# Patient Record
Sex: Female | Born: 1990 | Race: Black or African American | Hispanic: No | Marital: Single | Smoking: Never smoker
Health system: Southern US, Community
[De-identification: ages and names within clinical notes are randomized; demographics above are authoritative.]

## PROBLEM LIST (undated history)

## (undated) DIAGNOSIS — J45909 Unspecified asthma, uncomplicated: Secondary | ICD-10-CM

---

## 2012-04-25 ENCOUNTER — Emergency Department (HOSPITAL_COMMUNITY)
Admission: EM | Admit: 2012-04-25 | Discharge: 2012-04-25 | Disposition: A | Discharge status: Home / Self Care | Payer: BC Managed Care – PPO | Attending: Emergency Medicine | Admitting: Emergency Medicine

## 2012-04-25 ENCOUNTER — Encounter (HOSPITAL_COMMUNITY): Payer: Self-pay | Admitting: Emergency Medicine

## 2012-04-25 ENCOUNTER — Emergency Department (HOSPITAL_COMMUNITY): Payer: BC Managed Care – PPO

## 2012-04-25 DIAGNOSIS — R509 Fever, unspecified: Secondary | ICD-10-CM | POA: Insufficient documentation

## 2012-04-25 DIAGNOSIS — J029 Acute pharyngitis, unspecified: Secondary | ICD-10-CM | POA: Insufficient documentation

## 2012-04-25 DIAGNOSIS — IMO0001 Reserved for inherently not codable concepts without codable children: Secondary | ICD-10-CM | POA: Insufficient documentation

## 2012-04-25 DIAGNOSIS — R110 Nausea: Secondary | ICD-10-CM | POA: Insufficient documentation

## 2012-04-25 LAB — POCT I-STAT, CHEM 8
BUN: 4 mg/dL — ABNORMAL LOW (ref 6–23)
HCT: 36 % (ref 36.0–46.0)
Hemoglobin: 12.2 g/dL (ref 12.0–15.0)
Sodium: 137 mEq/L (ref 135–145)
TCO2: 23 mmol/L (ref 0–100)

## 2012-04-25 LAB — RAPID STREP SCREEN (MED CTR MEBANE ONLY): Streptococcus, Group A Screen (Direct): NEGATIVE

## 2012-04-25 MED ORDER — ACETAMINOPHEN 325 MG PO TABS
650.0000 mg | ORAL_TABLET | Freq: Once | ORAL | Status: AC
  Administered 2012-04-25: 650 mg via ORAL
  Filled 2012-04-25: qty 2

## 2012-04-25 MED ORDER — KETOROLAC TROMETHAMINE 30 MG/ML IJ SOLN
30.0000 mg | Freq: Once | INTRAMUSCULAR | Status: AC
  Administered 2012-04-25: 30 mg via INTRAVENOUS
  Filled 2012-04-25: qty 1

## 2012-04-25 MED ORDER — SODIUM CHLORIDE 0.9 % IV BOLUS (SEPSIS)
1000.0000 mL | Freq: Once | INTRAVENOUS | Status: AC
  Administered 2012-04-25: 1000 mL via INTRAVENOUS

## 2012-04-25 MED ORDER — ONDANSETRON HCL 4 MG/2ML IJ SOLN
4.0000 mg | Freq: Once | INTRAMUSCULAR | Status: AC
  Administered 2012-04-25: 4 mg via INTRAVENOUS
  Filled 2012-04-25: qty 2

## 2012-04-25 MED ORDER — OSELTAMIVIR PHOSPHATE 75 MG PO CAPS: 75.0000 mg | ORAL_CAPSULE | Freq: Two times a day (BID) | ORAL | Status: DC

## 2012-04-25 NOTE — ED Provider Notes (Signed)
History     CSN: [id number]  Arrival date & time 04/25/12  8295   First MD Initiated Contact with Patient 04/25/12 [phone]      Chief Complaint  Patient presents with  . Influenza    (Consider location/radiation/quality/duration/timing/severity/associated sxs/prior treatment) HPI Pt presents with 2 days of cough, sore throat, fever, body aches, also nausea.  She had emesis last night- nonbloody and nonbilious.  Fever began last night.  No abdominal pain.  Did not get flu shot.  No specific sick contacts.  She has not had tylenol or ibuprofen today.  Has been able to drink liquids, but less than usual.  There are no other associated systemic symptoms, there are no other alleviating or modifying factors.  History reviewed. No pertinent past medical history.  History reviewed. No pertinent past surgical history.  No family history on file.  History  Substance Use Topics  . Smoking status: Never Smoker   . Smokeless tobacco: Not on file  . Alcohol Use: Yes     Comment: occasional    OB History    Grav Para Term Preterm Abortions TAB SAB Ect Mult Living                  Review of Systems ROS reviewed and all otherwise negative except for mentioned in HPI  Allergies  Review of patient's allergies indicates no known allergies.  Home Medications   Current Outpatient Rx  Name  Route  Sig  Dispense  Refill  . ALBUTEROL SULFATE HFA 108 (90 BASE) MCG/ACT IN AERS   Inhalation   Inhale 1 puff into the lungs every 6 (six) hours as needed. For shortness of breath         . OSELTAMIVIR PHOSPHATE 75 MG PO CAPS   Oral   Take 1 capsule (75 mg total) by mouth every 12 (twelve) hours.   10 capsule   0     BP 122/64  Pulse 114  Temp 99.8 F (37.7 C) (Oral)  Resp 18  SpO2 100%  LMP 04/18/2012 Vitals reviewed Physical Exam Physical Examination: General appearance - alert, well appearing, and in no distress Mental status - alert, oriented to person, place, and time Eyes - no  scleral icterus, no conjunctival injection Mouth - mucous membranes moist, pharynx normal without lesions Chest - clear to auscultation, no wheezes, rales or rhonchi, symmetric air entry Heart - normal rate, regular rhythm, normal S1, S2, no murmurs, rubs, clicks or gallops Abdomen - soft, nontender, nondistended, no masses or organomegaly, nabs Extremities - peripheral pulses normal, no pedal edema, no clubbing or cyanosis Skin - normal coloration and turgor, no rashes  ED Course  Procedures (including critical care time)   11:41 AM Pt initially febrile and tachycardic, she has received 2L IV hydration with some improvement in HR, but still tachy- so hgb checked- pt not anemic.  She states she feels improved and is requesting discharge.  Pt still mildly tachycardic, instructed to increase fluid intake.   Labs Reviewed  POCT I-STAT, CHEM 8 - Abnormal; Notable for the following:    BUN 4 (*)     Glucose, Bld 114 (*)     All other components within normal limits  RAPID STREP SCREEN   Dg Chest 2 View  04/25/2012  *RADIOLOGY REPORT*  Clinical Data: Cough, cold, fever.  CHEST - 2 VIEW  Comparison: None.  Findings: There is mild central peribronchial thickening.  No confluent airspace infiltrate or overt edema.  No effusion.  Heart size normal.  Visualized bones unremarkable.  IMPRESSION:  Mild central peribronchial thickening suggesting bronchitis, asthma, or viral syndrome.   Original Report Authenticated By: D. Andria Rhein, MD      1. Influenza-like illness       MDM  Pt presenting with c/o fever, body aches, cough and sore throat.  Symptoms most likey viral or influenza like illness.  Pt with CXR reassuring, negative rapid strep.  Pt hydrated with IV fluids with some improvement in heart rate.  Not anemic.  Pt feels improved and is requesting to be discharged.  Discharged with strict return precautions.  Pt agreeable with plan.        Ethelda Chick, MD 04/25/12 1146

## 2012-04-25 NOTE — ED Notes (Signed)
Pt c/o cough, fever, sore throat, emesis x 2 days.

## 2012-11-24 ENCOUNTER — Emergency Department (HOSPITAL_COMMUNITY): Payer: BC Managed Care – PPO

## 2012-11-24 ENCOUNTER — Encounter (HOSPITAL_COMMUNITY): Payer: Self-pay | Admitting: *Deleted

## 2012-11-24 ENCOUNTER — Emergency Department (HOSPITAL_COMMUNITY)
Admission: EM | Admit: 2012-11-24 | Discharge: 2012-11-24 | Disposition: A | Discharge status: Home / Self Care | Payer: BC Managed Care – PPO | Attending: Emergency Medicine | Admitting: Emergency Medicine

## 2012-11-24 DIAGNOSIS — IMO0002 Reserved for concepts with insufficient information to code with codable children: Secondary | ICD-10-CM | POA: Insufficient documentation

## 2012-11-24 DIAGNOSIS — S0993XA Unspecified injury of face, initial encounter: Secondary | ICD-10-CM | POA: Insufficient documentation

## 2012-11-24 DIAGNOSIS — S81812A Laceration without foreign body, left lower leg, initial encounter: Secondary | ICD-10-CM

## 2012-11-24 DIAGNOSIS — Z3202 Encounter for pregnancy test, result negative: Secondary | ICD-10-CM | POA: Insufficient documentation

## 2012-11-24 DIAGNOSIS — S91009A Unspecified open wound, unspecified ankle, initial encounter: Secondary | ICD-10-CM | POA: Insufficient documentation

## 2012-11-24 DIAGNOSIS — Y92410 Unspecified street and highway as the place of occurrence of the external cause: Secondary | ICD-10-CM | POA: Insufficient documentation

## 2012-11-24 DIAGNOSIS — S81009A Unspecified open wound, unspecified knee, initial encounter: Secondary | ICD-10-CM | POA: Insufficient documentation

## 2012-11-24 DIAGNOSIS — Y9389 Activity, other specified: Secondary | ICD-10-CM | POA: Insufficient documentation

## 2012-11-24 DIAGNOSIS — T07XXXA Unspecified multiple injuries, initial encounter: Secondary | ICD-10-CM

## 2012-11-24 LAB — PREGNANCY, URINE: Preg Test, Ur: NEGATIVE

## 2012-11-24 MED ORDER — ONDANSETRON HCL 4 MG/2ML IJ SOLN
4.0000 mg | Freq: Once | INTRAMUSCULAR | Status: AC
  Administered 2012-11-24: 4 mg via INTRAVENOUS
  Filled 2012-11-24: qty 2

## 2012-11-24 MED ORDER — HYDROCODONE-ACETAMINOPHEN 5-325 MG PO TABS: 1.0000 | ORAL_TABLET | Freq: Four times a day (QID) | ORAL | Status: DC | PRN

## 2012-11-24 MED ORDER — TETANUS-DIPHTH-ACELL PERTUSSIS 5-2.5-18.5 LF-MCG/0.5 IM SUSP: 0.5000 mL | Freq: Once | INTRAMUSCULAR | Status: DC

## 2012-11-24 MED ORDER — BUPIVACAINE-EPINEPHRINE (PF) 0.5% -1:200000 IJ SOLN
INTRAMUSCULAR | Status: AC
  Filled 2012-11-24: qty 10

## 2012-11-24 MED ORDER — IBUPROFEN 800 MG PO TABS: 800.0000 mg | ORAL_TABLET | Freq: Three times a day (TID) | ORAL | Status: AC

## 2012-11-24 MED ORDER — HYDROMORPHONE HCL PF 1 MG/ML IJ SOLN
1.0000 mg | Freq: Once | INTRAMUSCULAR | Status: AC
  Administered 2012-11-24: 1 mg via INTRAVENOUS
  Filled 2012-11-24: qty 1
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
## 2012-11-24 NOTE — ED Notes (Signed)
Patient is alert and oriented x3.  She was given DC instructions and follow up visit instructions.  Patient gave verbal understanding. She was DC ambulatory under her own power to home.  V/S stable.  He was not showing any signs of distress on DC 

## 2012-12-17 ENCOUNTER — Encounter (HOSPITAL_COMMUNITY): Payer: Self-pay | Admitting: *Deleted

## 2012-12-17 ENCOUNTER — Emergency Department (HOSPITAL_COMMUNITY)
Admission: EM | Admit: 2012-12-17 | Discharge: 2012-12-17 | Disposition: A | Discharge status: Home / Self Care | Payer: BC Managed Care – PPO | Attending: Emergency Medicine | Admitting: Emergency Medicine

## 2012-12-17 DIAGNOSIS — Z3202 Encounter for pregnancy test, result negative: Secondary | ICD-10-CM | POA: Insufficient documentation

## 2012-12-17 DIAGNOSIS — J45909 Unspecified asthma, uncomplicated: Secondary | ICD-10-CM | POA: Insufficient documentation

## 2012-12-17 DIAGNOSIS — Z791 Long term (current) use of non-steroidal anti-inflammatories (NSAID): Secondary | ICD-10-CM | POA: Insufficient documentation

## 2012-12-17 DIAGNOSIS — Z792 Long term (current) use of antibiotics: Secondary | ICD-10-CM | POA: Insufficient documentation

## 2012-12-17 DIAGNOSIS — Z79899 Other long term (current) drug therapy: Secondary | ICD-10-CM | POA: Insufficient documentation

## 2012-12-17 DIAGNOSIS — Z87828 Personal history of other (healed) physical injury and trauma: Secondary | ICD-10-CM | POA: Insufficient documentation

## 2012-12-17 DIAGNOSIS — L02419 Cutaneous abscess of limb, unspecified: Secondary | ICD-10-CM | POA: Insufficient documentation

## 2012-12-17 DIAGNOSIS — L0390 Cellulitis, unspecified: Secondary | ICD-10-CM

## 2012-12-17 HISTORY — DX: Unspecified asthma, uncomplicated: J45.909

## 2012-12-17 LAB — URINALYSIS, ROUTINE W REFLEX MICROSCOPIC
Bilirubin Urine: NEGATIVE
Nitrite: NEGATIVE
Specific Gravity, Urine: 1.019 (ref 1.005–1.030)
Urobilinogen, UA: 0.2 mg/dL (ref 0.0–1.0)
pH: 7.5 (ref 5.0–8.0)

## 2012-12-17 LAB — BASIC METABOLIC PANEL
Chloride: 102 mEq/L (ref 96–112)
GFR calc Af Amer: >90 mL/min (ref >90)
GFR calc non Af Amer: >90 mL/min (ref >90)
Potassium: 4 mEq/L (ref 3.5–5.1)
Sodium: 138 mEq/L (ref 135–145)

## 2012-12-17 LAB — CBC WITH DIFFERENTIAL/PLATELET
Basophils Absolute: 0 10*3/uL (ref 0.0–0.1)
Eosinophils Absolute: 0 10*3/uL (ref 0.0–0.7)
HCT: 40.3 % (ref 36.0–46.0)
Lymphocytes Relative: 50 % — ABNORMAL HIGH (ref 12–46)
MCHC: 33 g/dL (ref 30.0–36.0)
Monocytes Relative: 7 % (ref 3–12)
Neutrophils Relative %: 41 % — ABNORMAL LOW (ref 43–77)
Platelets: 339 10*3/uL (ref 150–400)
RDW: 15.7 % — ABNORMAL HIGH (ref 11.5–15.5)
WBC: 4 10*3/uL (ref 4.0–10.5)
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
## 2013-01-07 ENCOUNTER — Encounter (HOSPITAL_COMMUNITY): Payer: Self-pay | Admitting: Emergency Medicine

## 2013-01-07 ENCOUNTER — Emergency Department (HOSPITAL_COMMUNITY)
Admission: EM | Admit: 2013-01-07 | Discharge: 2013-01-07 | Disposition: A | Discharge status: Home / Self Care | Payer: BC Managed Care – PPO | Source: Home / Self Care | Attending: Family Medicine | Admitting: Family Medicine

## 2013-01-07 DIAGNOSIS — S8012XD Contusion of left lower leg, subsequent encounter: Secondary | ICD-10-CM

## 2013-01-07 DIAGNOSIS — M7989 Other specified soft tissue disorders: Secondary | ICD-10-CM

## 2013-01-07 MED ORDER — TRAMADOL HCL 50 MG PO TABS: 50.0000 mg | ORAL_TABLET | Freq: Four times a day (QID) | ORAL | Status: DC | PRN
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
## 2013-01-08 ENCOUNTER — Other Ambulatory Visit (HOSPITAL_COMMUNITY): Payer: Self-pay | Admitting: Diagnostic Radiology

## 2013-01-09 ENCOUNTER — Ambulatory Visit
Admission: RE | Admit: 2013-01-09 | Discharge: 2013-01-09 | Disposition: A | Discharge status: Home / Self Care | Payer: BC Managed Care – PPO | Source: Ambulatory Visit | Attending: Family Medicine | Admitting: Family Medicine

## 2013-01-15 ENCOUNTER — Ambulatory Visit (INDEPENDENT_AMBULATORY_CARE_PROVIDER_SITE_OTHER): Payer: BC Managed Care – PPO | Admitting: Emergency Medicine

## 2013-01-15 VITALS — BP 130/90 | Ht 67.0 in | Wt 200.0 lb

## 2013-01-15 DIAGNOSIS — M25569 Pain in unspecified knee: Secondary | ICD-10-CM

## 2013-01-15 DIAGNOSIS — M25562 Pain in left knee: Secondary | ICD-10-CM
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
## 2013-01-15 NOTE — Patient Instructions (Addendum)
Your leg looks like there is some inflammation and scar tissue, but it will most likely heal on its own.  To help keep the fluid out, we are going to give you a compression sleeve. Wear this for the next 2-3 weeks, especially if you are going to be on your feet a lot.   Keep taking the tramadol for pain, but also add in some over the counter motrin for anti-inflammatory effect. You can take 2-3 pills (400-600mg ) every 8 hours.  Try to keep icing the area and keep the leg elevated as much as possible.  Hematoma A hematoma is a pocket of blood that collects under the skin, in an organ, in a body space, in a joint space, or in other tissue. The blood can clot to form a lump that you can see and feel. The lump is often firm, sore, and sometimes even painful and tender. Most hematomas get better in a few days to weeks. However, some hematomas may be serious and require medical care.Hematomas can range in size from very small to very large. CAUSES  A hematoma can be caused by a blunt or penetrating injury. It can also be caused by leakage from a blood vessel under the skin. Spontaneous leakage from a blood vessel is more likely to occur in elderly people, especially those taking blood thinners. Sometimes, a hematoma can develop after certain medical procedures. SYMPTOMS  Unlike a bruise, a hematoma forms a firm lump that you can feel. This lump is the collection of blood. The collection of blood can also cause your skin to turn a blue to dark blue color. If the hematoma is close to the surface of the skin, it often produces a yellowish color in the skin. DIAGNOSIS  Your caregiver can determine whether you have a hematoma based on your history and a physical exam. TREATMENT  Hematomas usually go away on their own over time. Rarely does the blood need to be drained out of the body. HOME CARE INSTRUCTIONS   Put ice on the injured area.  Put ice in a plastic bag.  Place a towel between your skin and  the bag.  Leave the ice on for 15-20 minutes, 3-4 times a day for the first 1 to 2 days.  After the first 2 days, switch to using warm compresses on the hematoma.  Elevate the injured area to help decrease pain and swelling. Wrapping the area with an elastic bandage may also be helpful. Compression helps to reduce swelling and promotes shrinking of the hematoma. Make sure the bandage is not wrapped too tight.  If your hematoma is on a lower extremity and is painful, crutches may be helpful for a couple days.  Only take over-the-counter or prescription medicines for pain, discomfort, or fever as directed by your caregiver. Most patients can take acetaminophen or ibuprofen for the pain. SEEK IMMEDIATE MEDICAL CARE IF:   You have increasing pain, or your pain is not controlled with medicine.  You have a fever.  You have worsening swelling or discoloration.  Your skin over the hematoma breaks or starts bleeding. MAKE SURE YOU:   Understand these instructions.  Will watch your condition.  Will get help right away if you are not doing well or get worse. Document Released: 11/23/2003 Document Revised: 07/03/2011 Document Reviewed: 12/12/2010 [facility] Patient Information 2014 [place], [place].

## 2013-07-16 ENCOUNTER — Ambulatory Visit (INDEPENDENT_AMBULATORY_CARE_PROVIDER_SITE_OTHER): Payer: BC Managed Care – PPO | Admitting: Emergency Medicine

## 2013-07-16 ENCOUNTER — Encounter: Payer: Self-pay | Admitting: Emergency Medicine

## 2013-07-16 VITALS — BP 136/93 | HR 80 | Ht 67.0 in | Wt 200.0 lb

## 2013-07-16 DIAGNOSIS — M25569 Pain in unspecified knee: Secondary | ICD-10-CM | POA: Insufficient documentation

## 2013-07-16 NOTE — Assessment & Plan Note (Addendum)
Patient continues to have some mild discomfort in her left lower leg with signs of hyperparesthesia. I suspect this is related to healing and scar tissue remodeling. Muscular skeletal ultrasound today showed some mild scar tissue within the anterior tibialis muscle and a very small fluid collection. There is no evidence of fascial defect. I recommended that she continue to wear the compression sleeve with activity however in the evenings to remove it and elevate the lower extremity.
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
## 2014-12-29 ENCOUNTER — Ambulatory Visit: Payer: Self-pay | Admitting: Family Medicine
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
## 2015-05-27 IMAGING — US US EXTREM LOW VENOUS*L*
1 series · 13 of 24 positions shown · non-contrast
Comparison: None.

CLINICAL DATA: Left lower extremity edema

EXAM:
VENOUS DUPLEX ULTRASOUND OF LEFT LOWER EXTREMITY
TECHNIQUE: Gray-scale sonography with graded compression, as well as color
Doppler and duplex ultrasound, were performed to evaluate the deep
venous system from the level of the common femoral vein through the
popliteal and proximal calf veins. Spectral Doppler was utilized to
evaluate flow at rest and with distal augmentation maneuvers.

[Series 1: us extrem low venous*left* · 13 of 36 slices shown]
[im 1/36]
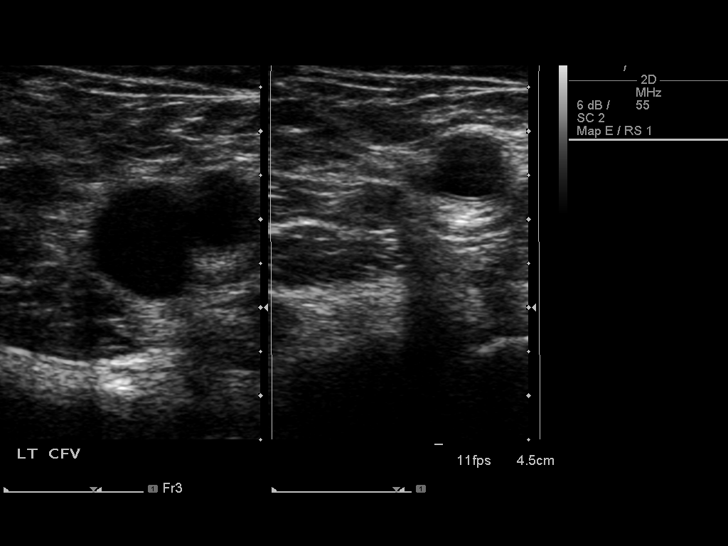
[im 4/36]
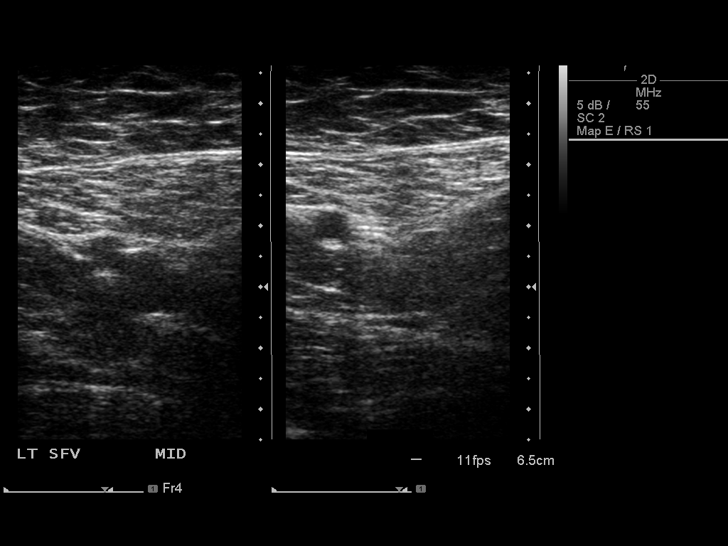
[im 7/36]
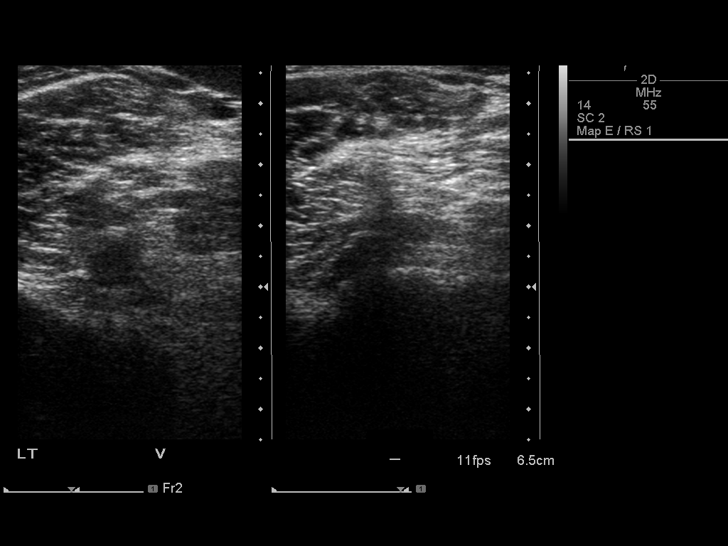
[im 10/36]
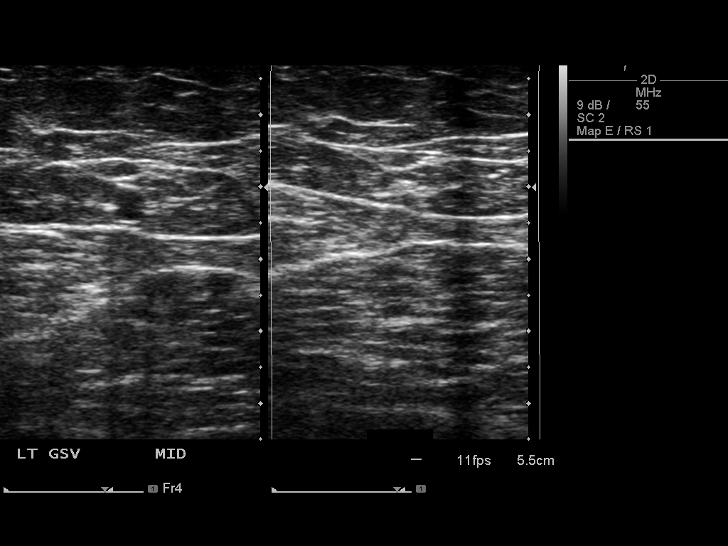
[im 13/36]
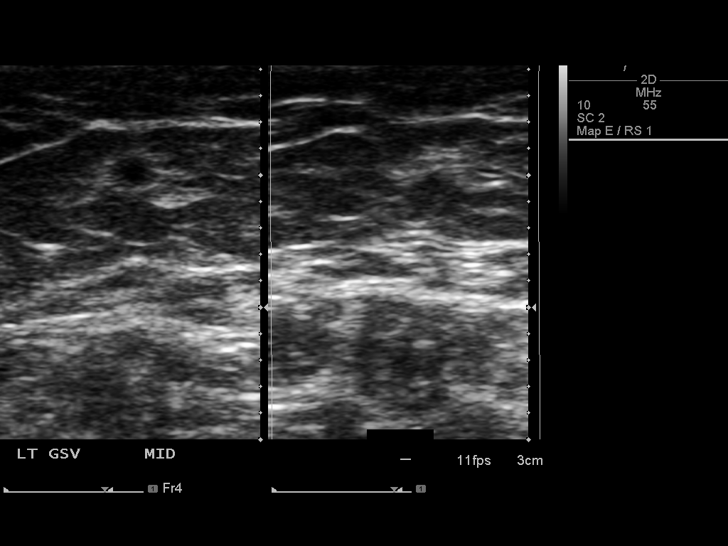
[im 16/36]
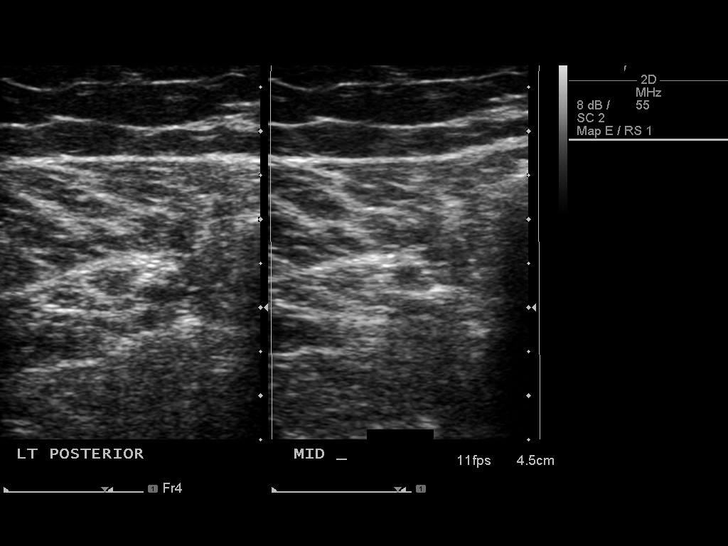
[im 19/36]
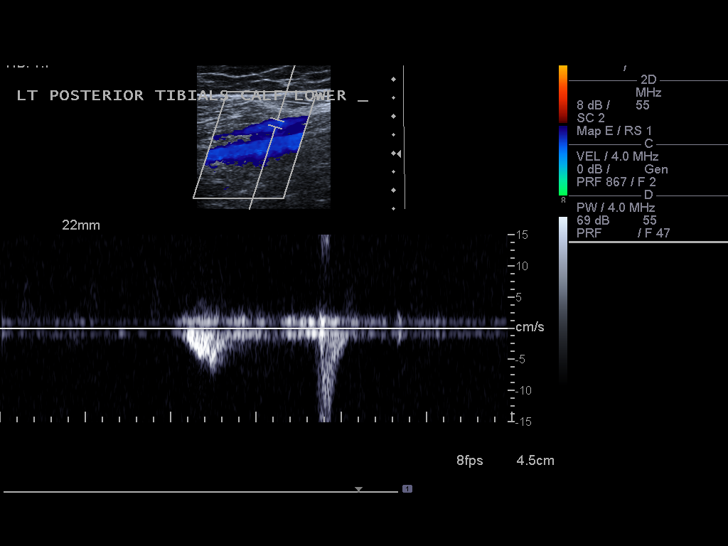
[im 20/36]
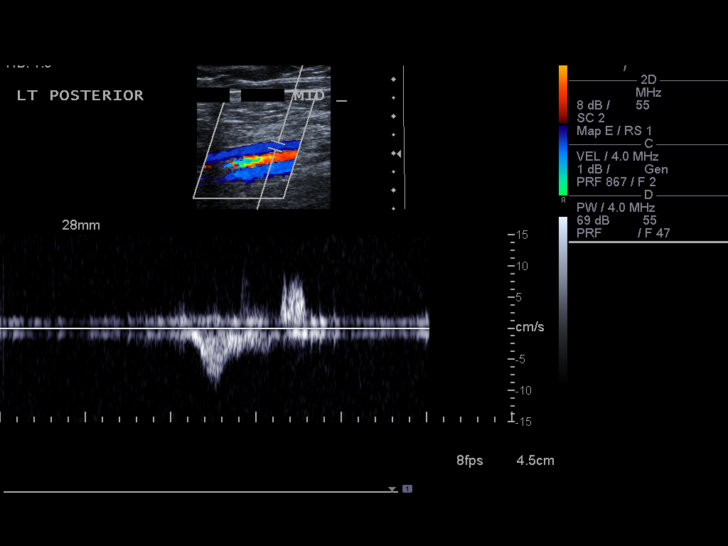
[im 23/36]
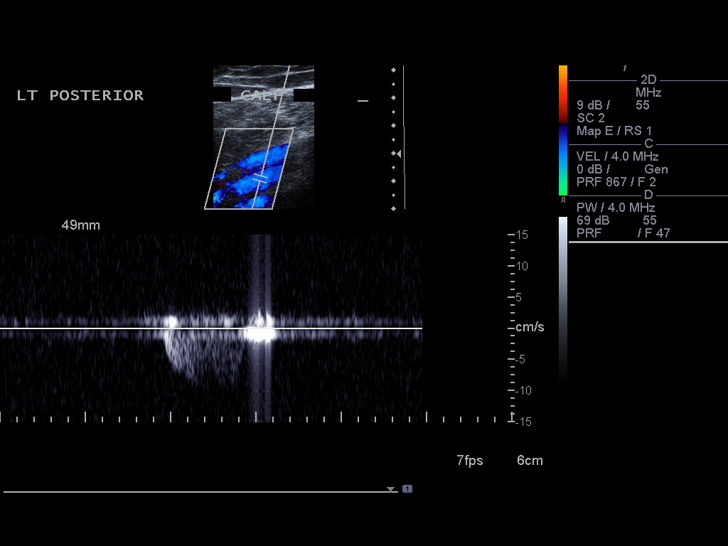
[im 26/36]
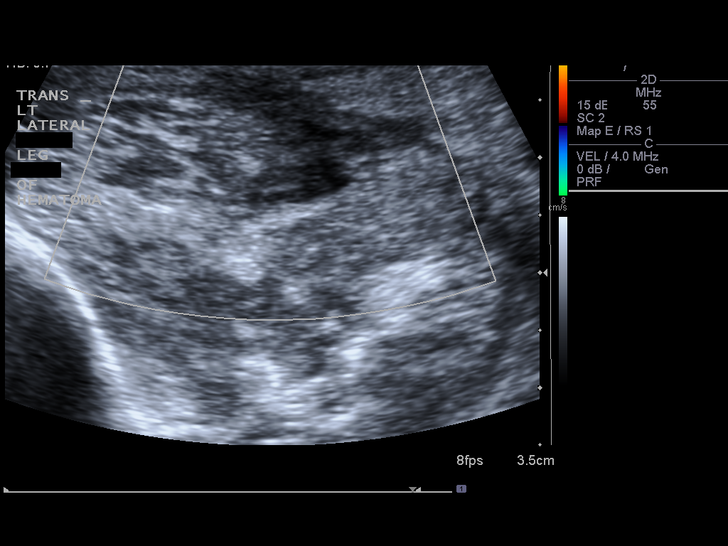
[im 29/36]
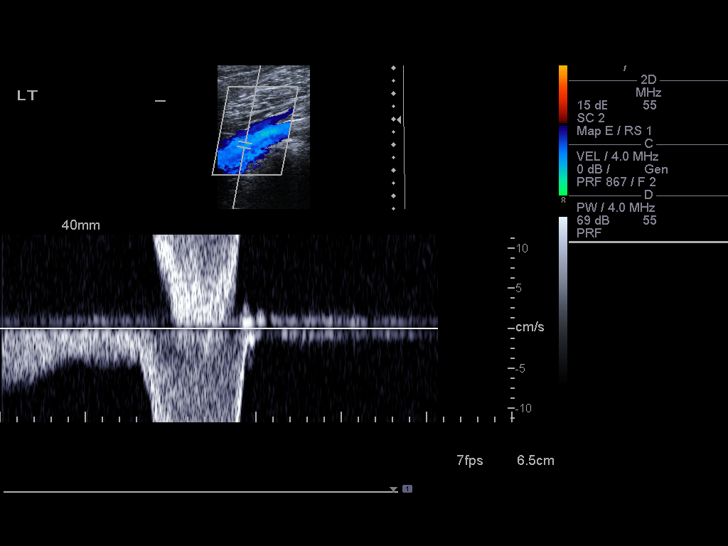
[im 32/36]
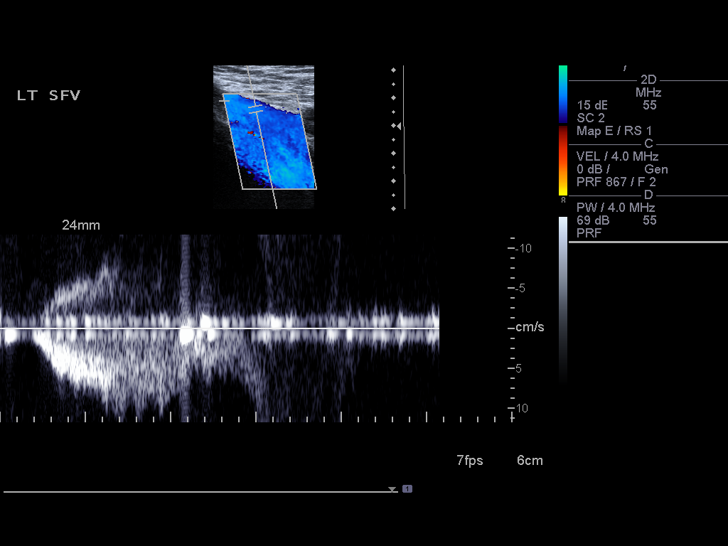
[im 36/36]
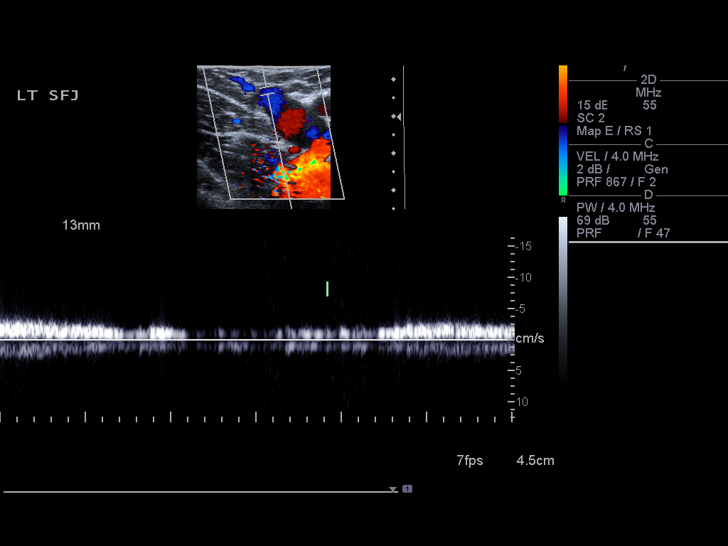

[13 of 24 positions shown; findings below may reference images not displayed]

FINDINGS: Flow in the venous structures of the left lower extremity is
spontaneous and phasic in all segments. There is normal compression
and augmentation in the venous structures of the left lower
extremity. Venous Doppler signal is normal in all regions. There is
no thrombus in the deep or visualized superficial venous structures
on the left. There is no deep venous incompetence.

In the lateral left lower leg region, there is edema. A small amount
of fluid in this area could represent liquefying hematoma. No
well-defined mass is seen in this area.
IMPRESSION: Soft tissue edema with probable liquefying hematoma in lateral left
lower extremity region. No deep venous thrombosis identified in the
left lower extremity. A significant portion of the left saphenous
vein is also seen; this vein is also patent in the visualized
regions.
